# Patient Record
Sex: Male | Born: 1995 | Race: White | Hispanic: No | Marital: Single | Smoking: Never smoker
Health system: Southern US, Community
[De-identification: ages and names within clinical notes are randomized; demographics above are authoritative.]

---
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
## 2017-02-07 ENCOUNTER — Emergency Department (HOSPITAL_COMMUNITY)
Admission: EM | Admit: 2017-02-07 | Discharge: 2017-02-08 | Disposition: A | Discharge status: Home / Self Care | Payer: Worker's Compensation | Attending: Emergency Medicine | Admitting: Emergency Medicine

## 2017-02-07 ENCOUNTER — Encounter (HOSPITAL_COMMUNITY): Payer: Self-pay | Admitting: Emergency Medicine

## 2017-02-07 DIAGNOSIS — Y998 Other external cause status: Secondary | ICD-10-CM | POA: Diagnosis not present

## 2017-02-07 DIAGNOSIS — S61211A Laceration without foreign body of left index finger without damage to nail, initial encounter: Secondary | ICD-10-CM | POA: Diagnosis present

## 2017-02-07 DIAGNOSIS — W228XXA Striking against or struck by other objects, initial encounter: Secondary | ICD-10-CM | POA: Diagnosis not present

## 2017-02-07 DIAGNOSIS — Y9389 Activity, other specified: Secondary | ICD-10-CM | POA: Insufficient documentation

## 2017-02-07 DIAGNOSIS — Y929 Unspecified place or not applicable: Secondary | ICD-10-CM | POA: Diagnosis not present

## 2017-02-07 MED ORDER — IBUPROFEN 400 MG PO TABS
600.0000 mg | ORAL_TABLET | Freq: Once | ORAL | Status: AC
  Administered 2017-02-07: 23:00:00 600 mg via ORAL
  Filled 2017-02-07: qty 1

## 2017-02-07 MED ORDER — LIDOCAINE HCL (PF) 1 % IJ SOLN
2.0000 mL | Freq: Once | INTRAMUSCULAR | Status: DC
  Filled 2017-02-07: qty 5

## 2017-02-07 MED ORDER — TETANUS-DIPHTH-ACELL PERTUSSIS 5-2.5-18.5 LF-MCG/0.5 IM SUSP
0.5000 mL | Freq: Once | INTRAMUSCULAR | Status: AC
  Administered 2017-02-07: 0.5 mL via INTRAMUSCULAR
  Filled 2017-02-07: qty 0.5

## 2017-02-07 NOTE — ED Provider Notes (Signed)
[facility]-EMERGENCY DEPT Provider Note   CSN: [id number] Arrival date & time: 02/07/17  2050     History   Chief Complaint Chief Complaint  Patient presents with  . Laceration    index finger    HPI Paul Brewer is a 21 y.o. male w/o significant PMH presenting to ED with concerns of laceration. Per pt, earlier this evening he obtained laceration when loading a metal rig on to truck. Laceration located just over proximal L index finger. Pt. States "It doesn't look like much, but it bled a lot and when I bend my finger it really opens up." No bony pain/tenderness or decreased ROM. Pt. Unsure of last tetanus shot. No meds taken PTA.   HPI  History reviewed. No pertinent past medical history.  There are no active problems to display for this patient.   History reviewed. No pertinent surgical history.     Home Medications    Prior to Admission medications   Not on File    Family History No family history on file.  Social History Social History  Substance Use Topics  . Smoking status: Never Smoker  . Smokeless tobacco: Current User  . Alcohol use Yes     Allergies   Patient has no known allergies.   Review of Systems Review of Systems  Musculoskeletal: Negative for arthralgias and joint swelling.  Skin: Positive for wound.  All other systems reviewed and are negative.    Physical Exam Updated Vital Signs BP (!) 143/59   Pulse 66   Temp 98.5 F (36.9 C) (Oral)   Resp 18   Ht 6\' 2"  (1.88 m)   Wt 79.4 kg (175 lb)   SpO2 100%   BMI 22.47 kg/m   Physical Exam  Constitutional: He is oriented to person, place, and time. Vital signs are normal. He appears well-developed and well-nourished. No distress.  HENT:  Head: Normocephalic and atraumatic.  Right Ear: External ear normal.  Left Ear: External ear normal.  Nose: Nose normal.  Mouth/Throat: Oropharynx is clear and moist and mucous membranes are normal.  Eyes: EOM are normal.  Neck: Normal range  of motion. Neck supple.  Cardiovascular: Normal rate, regular rhythm, normal heart sounds and intact distal pulses.   Pulses:      Radial pulses are 2+ on the right side, and 2+ on the left side.  Pulmonary/Chest: Effort normal and breath sounds normal. No respiratory distress.  Lungs CTAB   Abdominal: Soft. He exhibits no distension. There is no tenderness.  Musculoskeletal: Normal range of motion.       Left hand: He exhibits laceration. He exhibits normal range of motion, no tenderness, no bony tenderness, normal capillary refill, no deformity and no swelling. Normal sensation noted. Normal strength noted.       Hands: Neurological: He is alert and oriented to person, place, and time. He exhibits normal muscle tone. Coordination normal.  Skin: Skin is warm and dry. Capillary refill takes less than 2 seconds.  Nursing note and vitals reviewed.    ED Treatments / Results  Labs (all labs ordered are listed, but only abnormal results are displayed) Labs Reviewed - No data to display  EKG  EKG Interpretation None       Radiology No results found.  Procedures .Marland KitchenLaceration Repair Date/Time: 02/07/2017 11:56 PM Performed by: Ronnell Freshwater Authorized by: Ronnell Freshwater   Consent:    Consent obtained:  Verbal   Consent given by:  Patient   Risks  discussed:  Infection, pain, retained foreign body, poor cosmetic result and poor wound healing Anesthesia (see MAR for exact dosages):    Anesthesia method:  Local infiltration   Local anesthetic:  Lidocaine 1% w/o epi Laceration details:    Location:  Finger   Finger location:  L index finger   Length (cm):  1.5 Repair type:    Repair type:  Simple Exploration:    Hemostasis achieved with:  Direct pressure   Wound exploration: wound explored through full range of motion and entire depth of wound probed and visualized     Contaminated: no   Treatment:    Area cleansed with:  Betadine and saline    Amount of cleaning:  Extensive   Irrigation solution:  Sterile saline   Irrigation volume:  250   Irrigation method:  Syringe   Visualized foreign bodies/material removed: no   Skin repair:    Repair method:  Sutures   Suture size:  4-0   Suture material:  Prolene   Suture technique:  Simple interrupted   Number of sutures:  3 Approximation:    Approximation:  Close   Vermilion border: well-aligned   Post-procedure details:    Dressing:  Antibiotic ointment, splint for protection and adhesive bandage   Patient tolerance of procedure:  Tolerated well, no immediate complications   (including critical care time)  Medications Ordered in ED Medications  lidocaine (PF) (XYLOCAINE) 1 % injection 2 mL (not administered)  Tdap (BOOSTRIX) injection 0.5 mL (0.5 mLs Intramuscular Given 02/07/17 2328)  ibuprofen (ADVIL,MOTRIN) tablet 600 mg (600 mg Oral Given 02/07/17 2327)     Initial Impression / Assessment and Plan / ED Course  I have reviewed the triage vital signs and the nursing notes.  Pertinent labs & imaging results that were available during my care of the patient were reviewed by me and considered in my medical decision making (see chart for details).     21 yo M presenting to ED with L index finger laceration, as described above. VSS.  On exam, pt is alert, non toxic w/MMM, good distal perfusion, in NAD. ~1.5cm linear laceration just between PIP, DIP. Gaping when bending finger. Hemostatic. No obvious foreign bodies. FROM, NVI w/normal sensation. No bony tenderness/pain. Physical exam is otherwise unremarkable from laceration. Tdap updated and Motrin given for pain. Wound cleaning complete with pressure irrigation, bottom of wound visualized, no foreign bodies appreciated. Laceration occurred < 8 hours prior to repair which was well tolerated. Pt has no co morbidities to effect normal wound healing. Finger splint provided for protection/support. Wound home care discussed and questions  answered. Pt to f-u for suture removal in 7 days. Return precautions discussed. Parent agreeable to plan. Pt is hemodynamically stable w no complaints prior to dc.   Final Clinical Impressions(s) / ED Diagnoses   Final diagnoses:  Laceration of left index finger without foreign body without damage to nail, initial encounter    New Prescriptions New Prescriptions   No medications on file     Ronnell Freshwateratterson, Carmin Dibartolo Honeycutt, NP 02/07/17 2357    Jerelyn ScottLinker, Martha, MD 02/08/17 [phone]

## 2017-02-07 NOTE — ED Triage Notes (Signed)
Pt c/o left index finger laceration, bleeding controlled at this time.

## 2017-05-11 ENCOUNTER — Encounter (HOSPITAL_COMMUNITY): Payer: Self-pay | Admitting: Nurse Practitioner

## 2017-05-11 ENCOUNTER — Emergency Department (HOSPITAL_COMMUNITY)
Admission: EM | Admit: 2017-05-11 | Discharge: 2017-05-12 | Disposition: A | Discharge status: Home / Self Care | Payer: Worker's Compensation | Attending: Emergency Medicine | Admitting: Emergency Medicine

## 2017-05-11 DIAGNOSIS — S0181XA Laceration without foreign body of other part of head, initial encounter: Secondary | ICD-10-CM | POA: Insufficient documentation

## 2017-05-11 DIAGNOSIS — Y939 Activity, unspecified: Secondary | ICD-10-CM | POA: Diagnosis not present

## 2017-05-11 DIAGNOSIS — Y9289 Other specified places as the place of occurrence of the external cause: Secondary | ICD-10-CM | POA: Diagnosis not present

## 2017-05-11 DIAGNOSIS — W2209XA Striking against other stationary object, initial encounter: Secondary | ICD-10-CM | POA: Diagnosis not present

## 2017-05-11 DIAGNOSIS — S0993XA Unspecified injury of face, initial encounter: Secondary | ICD-10-CM | POA: Diagnosis present

## 2017-05-11 DIAGNOSIS — Y990 Civilian activity done for income or pay: Secondary | ICD-10-CM | POA: Diagnosis not present

## 2017-05-11 MED ORDER — IBUPROFEN 400 MG PO TABS
ORAL_TABLET | ORAL | Status: AC
  Filled 2017-05-11: qty 1

## 2017-05-11 MED ORDER — LIDOCAINE-EPINEPHRINE (PF) 2 %-1:200000 IJ SOLN
10.0000 mL | Freq: Once | INTRAMUSCULAR | Status: AC
  Administered 2017-05-11: 10 mL
  Filled 2017-05-11: qty 20

## 2017-05-11 MED ORDER — IBUPROFEN 400 MG PO TABS
400.0000 mg | ORAL_TABLET | Freq: Once | ORAL | Status: AC | PRN
  Administered 2017-05-11: 400 mg via ORAL

## 2017-05-11 NOTE — ED Triage Notes (Signed)
Patient ran into metal beam and work approximately 3cm laceration above nose on forehead. Bleeding controlled. Last tetanus 2 months ago.

## 2017-05-12 ENCOUNTER — Encounter (HOSPITAL_COMMUNITY): Payer: Self-pay | Admitting: Emergency Medicine

## 2017-05-12 NOTE — ED Provider Notes (Signed)
[facility]   CSN: [id number] Arrival date & time: 05/11/17  2023     History   Chief Complaint Chief Complaint  Patient presents with  . Laceration    HPI Paul Brewer is a 21 y.o. male presents to the ED for evaluation of laceration to area between eyebrow sustained while at work.Patient states he hit his forehead onto a steel beam. Reported significant bleeding initially however bleeding has been controlled. Last tetanus 2 months ago. Reports focal tenderness around laceration but no headache, LOC, neck pain, eye pain, nose pain. No anticoagulants.  HPI  History reviewed. No pertinent past medical history.  There are no active problems to display for this patient.   History reviewed. No pertinent surgical history.     Home Medications    Prior to Admission medications   Not on File    Family History No family history on file.  Social History Social History  Substance Use Topics  . Smoking status: Never Smoker  . Smokeless tobacco: Never Used  . Alcohol use Not on file     Comment: occastionally     Allergies   Patient has no known allergies.   Review of Systems Review of Systems  HENT: Positive for facial swelling. Negative for nosebleeds and sinus pain.   Eyes: Negative for pain, redness and visual disturbance.  Musculoskeletal: Negative for neck pain.  Skin: Positive for wound.  Neurological: Positive for headaches. Negative for syncope.  Hematological: Does not bruise/bleed easily.     Physical Exam Updated Vital Signs BP 135/65 (BP Location: Right Arm)   Pulse (!) 59   Temp 97.8 F (36.6 C) (Oral)   Resp 12   Ht 6\' 1"  (1.854 m)   Wt 77.1 kg (170 lb)   SpO2 97%   BMI 22.43 kg/m   Physical Exam  Constitutional: He is oriented to person, place, and time. He appears well-developed and well-nourished. No distress.  NAD.  HENT:  Head: Normocephalic and atraumatic.  Right Ear:  External ear normal.  Left Ear: External ear normal.  Nose: Nose normal.  No facial, orbital or nasal bone tenderness No intranasal or intraoral signs of injury or bleeds  Eyes: Conjunctivae are normal. No scleral icterus.  EOMs and PERRL intact bilaterally, painless No signs of entrapment   Neck: Normal range of motion. Neck supple.  Cardiovascular: Normal rate, regular rhythm, normal heart sounds and intact distal pulses.   No murmur heard. Pulmonary/Chest: Effort normal and breath sounds normal. He has no wheezes.  Musculoskeletal: Normal range of motion. He exhibits no deformity.  Neurological: He is alert and oriented to person, place, and time.  Strength 5/5 with hand grip and ankle flexion/extension.   Sensation to light touch intact in hands and feet. Gait normal.    CN I not tested CN II full visual fields bilaterally CN III, IV, VI PEERL and EOMs intact bilaterally CN V light touch intact in all 3 divisions of trigeminal nerve CN VII facial nerve movements intact, symmetric, bilaterally CN VIII hearing intact to finger rub, bilaterally CN IX, X no uvula deviation, symmetric soft palate rise CN XI 5/5 SCM and trapezius strength bilaterally  CN XII Tongue midline with symmetric L/R movement  Skin: Skin is warm and dry. Capillary refill takes less than 2 seconds. Laceration noted.  3 cm laceration across area between eye brows  Psychiatric: He has a normal mood and affect. His behavior is normal. Judgment and thought  content normal.  Nursing note and vitals reviewed.    ED Treatments / Results  Labs (all labs ordered are listed, but only abnormal results are displayed) Labs Reviewed - No data to display  EKG  EKG Interpretation None       Radiology No results found.  Procedures .Marland KitchenLaceration Repair Date/Time: 05/12/2017 12:50 AM Performed by: Liberty Handy Authorized by: Liberty Handy   Consent:    Consent obtained:  Verbal   Consent given by:   Patient   Risks discussed:  Infection, need for additional repair, pain, poor cosmetic result and nerve damage   Alternatives discussed:  Referral and delayed treatment Anesthesia (see MAR for exact dosages):    Anesthesia method:  Local infiltration   Local anesthetic:  Lidocaine 2% WITH epi Laceration details:    Location:  Face   Facial location: in between eyebrows.   Length (cm):  3 Repair type:    Repair type:  Intermediate Pre-procedure details:    Preparation:  Patient was prepped and draped in usual sterile fashion and imaging obtained to evaluate for foreign bodies Exploration:    Hemostasis achieved with:  Direct pressure and epinephrine   Wound exploration: wound explored through full range of motion and entire depth of wound probed and visualized     Wound extent: no foreign bodies/material noted and no nerve damage noted     Contaminated: no   Treatment:    Wound cleansed with: chlorohexidine.   Amount of cleaning:  Standard   Irrigation solution:  Sterile saline   Irrigation method:  Tap   Visualized foreign bodies/material removed: no   Skin repair:    Repair method:  Sutures   Suture size:  6-0   Suture material:  Prolene   Number of sutures:  6 Approximation:    Approximation:  Close   Vermilion border: well-aligned   Post-procedure details:    Dressing:  Open (no dressing)   Patient tolerance of procedure:  Tolerated well, no immediate complications   (including critical care time)   Medications Ordered in ED Medications  ibuprofen (ADVIL,MOTRIN) tablet 400 mg (400 mg Oral Given 05/11/17 2037)  lidocaine-EPINEPHrine (XYLOCAINE W/EPI) 2 %-1:200000 (PF) injection 10 mL (10 mLs Infiltration Given by Other 05/11/17 2304)     Initial Impression / Assessment and Plan / ED Course  I have reviewed the triage vital signs and the nursing notes.  Pertinent labs & imaging results that were available during my care of the patient were reviewed by me and  considered in my medical decision making (see chart for details).    Patient is a 21 y.o. yo male that presents with laceration to area between eye brows sustained while at work today. Tdap UTD. Irrigation performed. Bottom of the wound visualized with bleeding controled, no foreign bodies seen or palpated.  No nerve injury noted. Full ROM of affected area of face without asymmetry. Repair done which was well tolerated. Pt has no co morbidities to effect normal wound healing. Discussed suture home care with pt and answered questions. Pt to follow up for wound check and suture removal in 3-5 days.   Final Clinical Impressions(s) / ED Diagnoses   Final diagnoses:  Facial laceration, initial encounter    New Prescriptions There are no discharge medications for this patient.    Liberty Handy, PA-C 05/12/17 0053    Lavera Guise, MD 05/13/17 [phone]

## 2017-05-12 NOTE — ED Notes (Signed)
Pt verbalized understanding discharge instructions and denies any further needs or questions at this time. VS stable, ambulatory and steady gait.   

## 2017-05-12 NOTE — Discharge Instructions (Signed)
You presented to the ED for a 3 centimeter laceration to your forehead sustained while at work.  Laceration was repaired with 6 sutures. Sutures need to be removed within 3-5 days.  You may take a shower in the next 24 hours. Use clean water and soap at least twice daily to rinse laceration. Also clean whenever dirty or excessive sweating. It is likely you will develop a small scar. Placing a small amount of antibacterial ointment, sunscreen and wearing a hat and prevent scarring.  Take Tylenol or ibuprofen for pain.  Monitor for signs of infection including increased swelling, pain, redness, warmth, pus or fevers.
